# Patient Record
Sex: Female | Born: 2000 | Hispanic: Yes | Marital: Married | Smoking: Never smoker
Health system: Southern US, Community
[De-identification: ages and names within clinical notes are randomized; demographics above are authoritative.]

## PROBLEM LIST (undated history)

## (undated) HISTORY — PX: NO PAST SURGERIES: SHX2092

---
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
## 2021-03-14 IMAGING — US US OB TRANSVAGINAL
1 series · 15 of 27 positions shown · non-contrast
Comparison: 04/30/2020 and 05/07/2020

CLINICAL DATA: Known right ectopic pregnancy. To assess
methotrexate response. Lower abdominal pain radiating to the back.
Estimated gestational age by LMP is 9 weeks 1 day. Quantitative beta
HCG was 905 on 05/10/2020 compared with 3433 on 05/07/2020.

EXAM:
TRANSVAGINAL OB ULTRASOUND
TECHNIQUE: Transvaginal ultrasound was performed for complete evaluation of the
gestation as well as the maternal uterus, adnexal regions, and
pelvic cul-de-sac.

[Series 1: us ob transvaginal · 27 acquisitions, 15 frames shown]
[im 1/27]
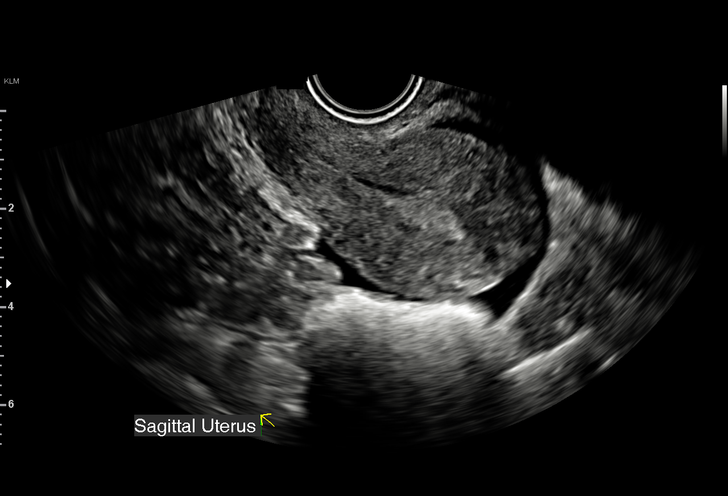
[im 3/27]
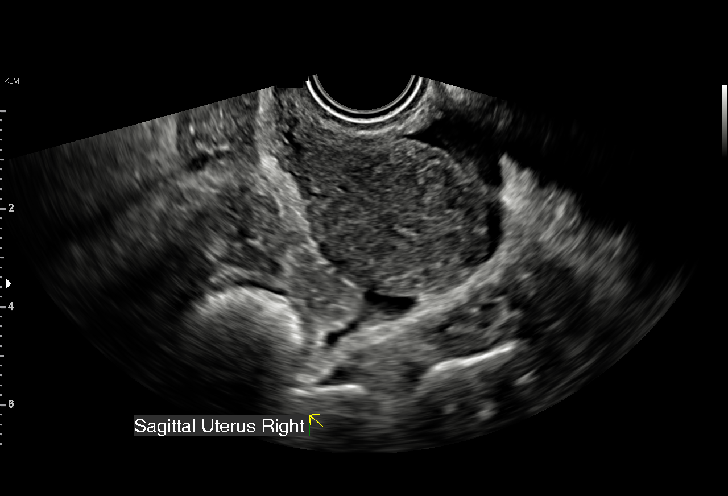
[im 5/27]
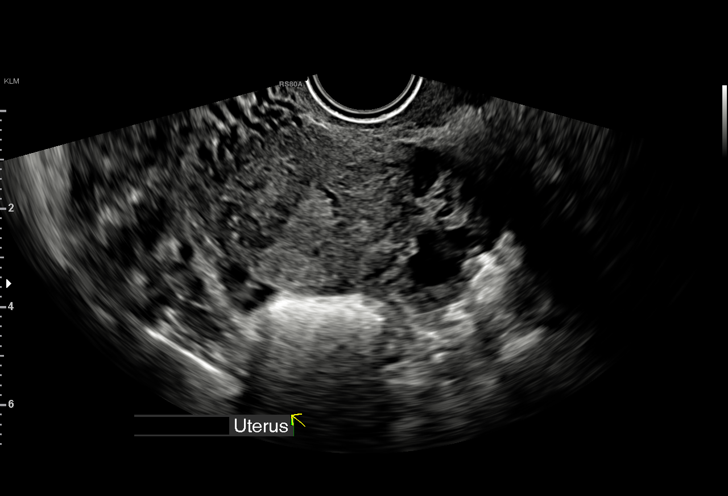
[im 7/27]
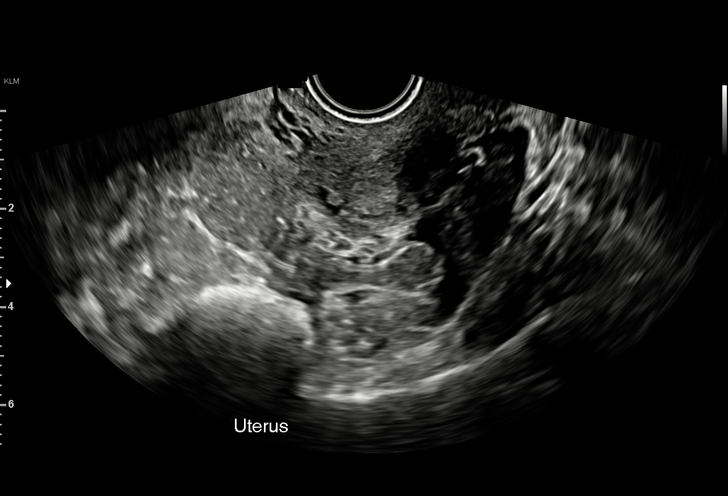
[im 9/27]
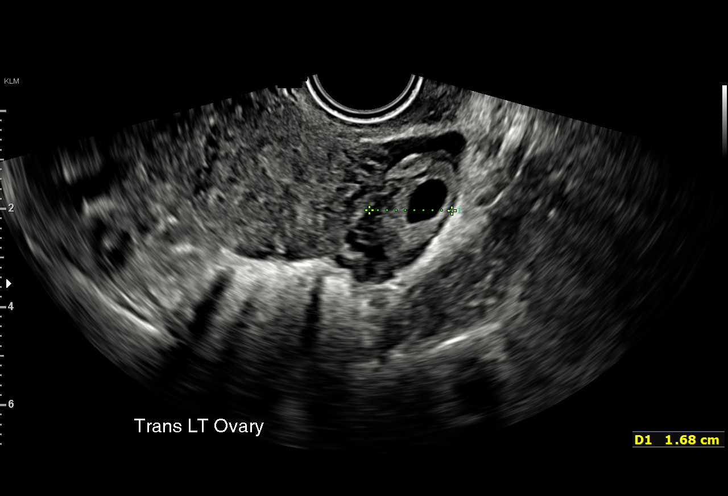
[im 10/27]
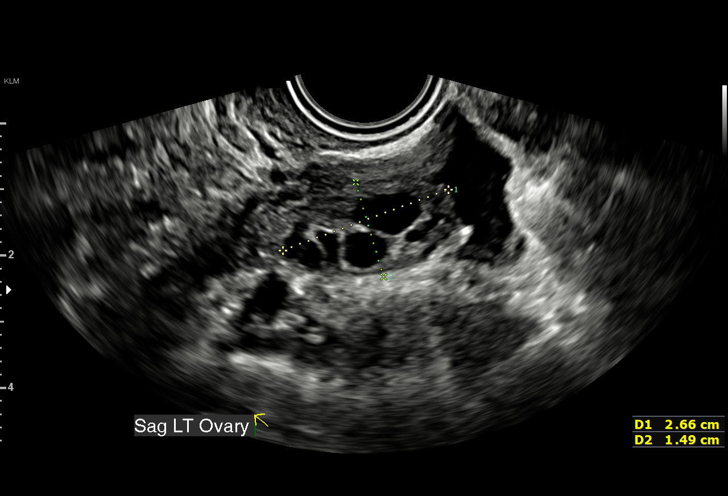
[im 12/27]
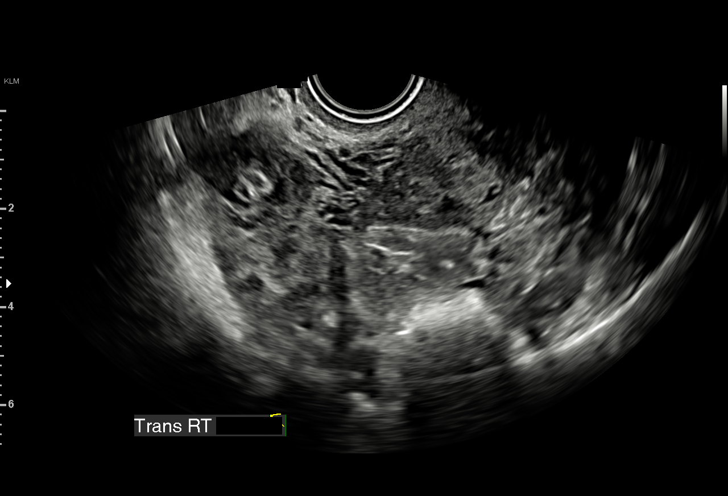
[im 14/27]
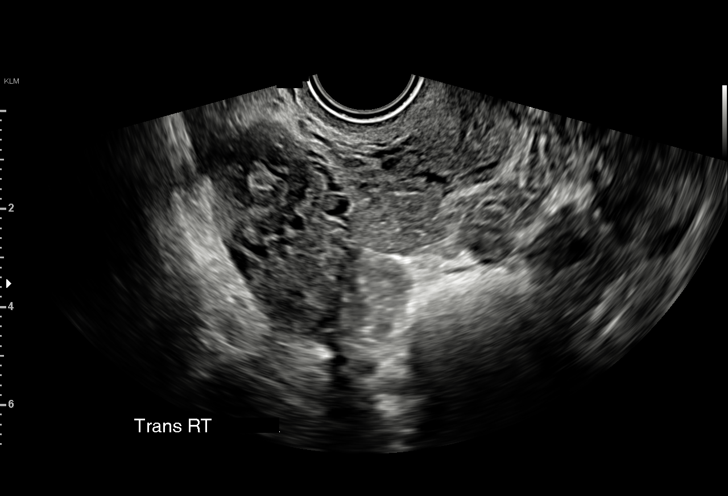
[im 16/27]
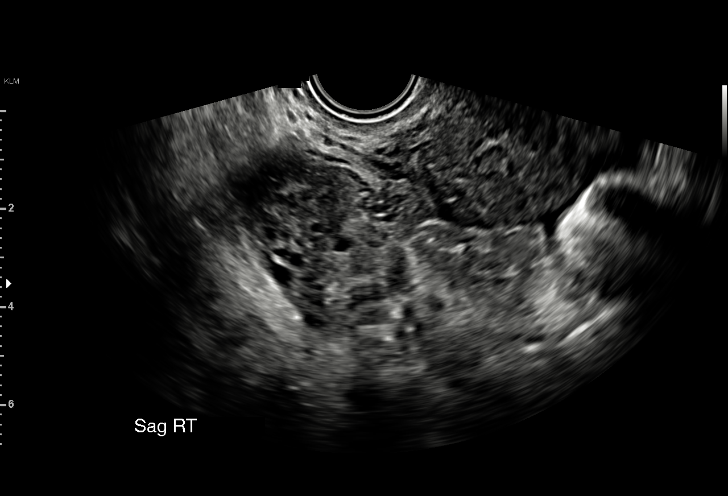
[im 18/27]
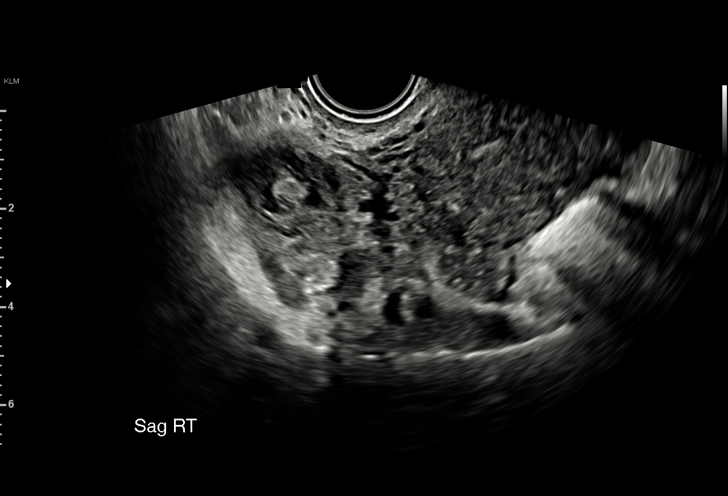
[im 19/27]
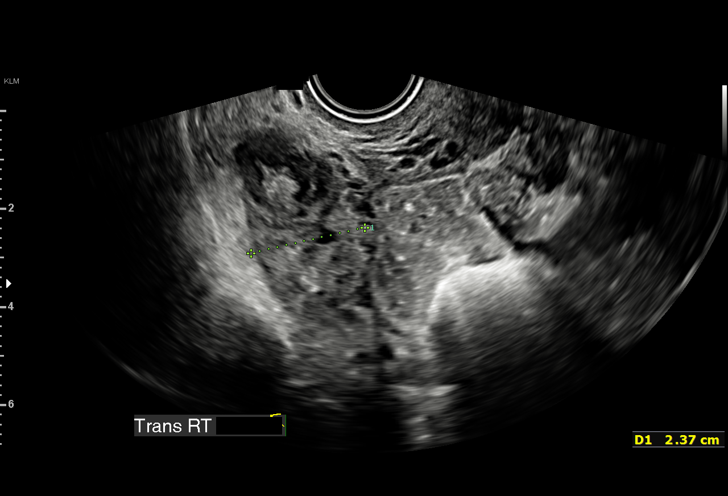
[im 21/27]
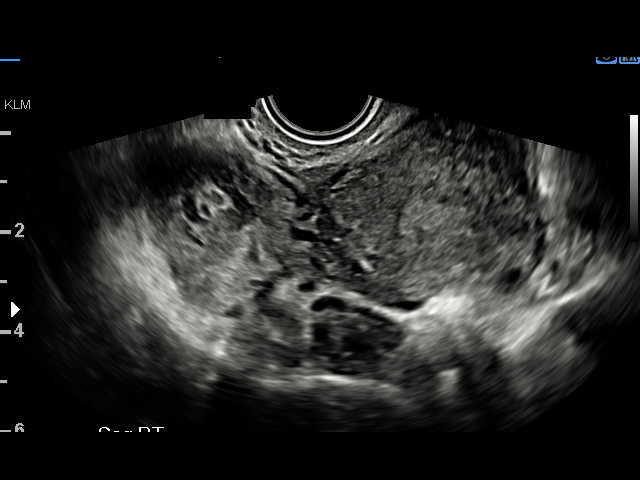
[im 23/27]
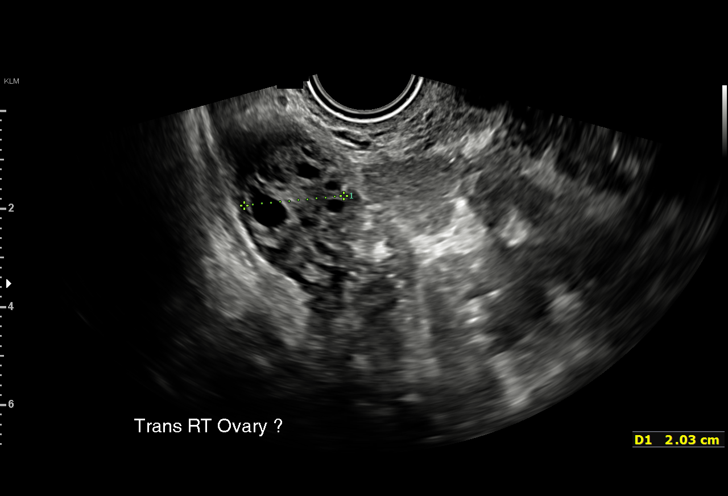
[im 25/27]
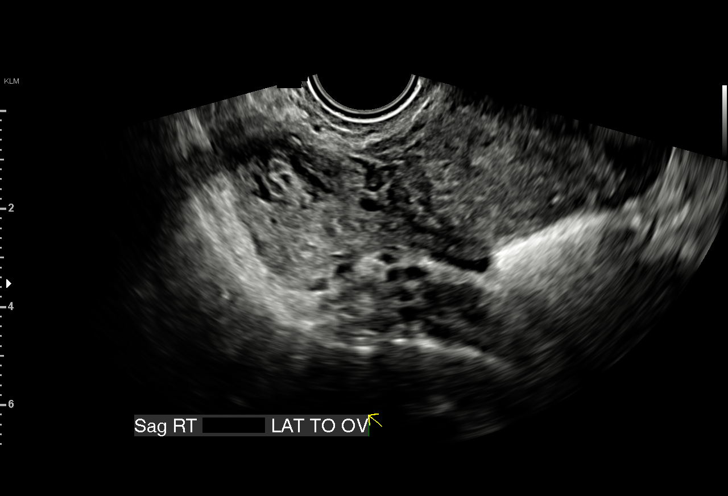
[im 27/27]
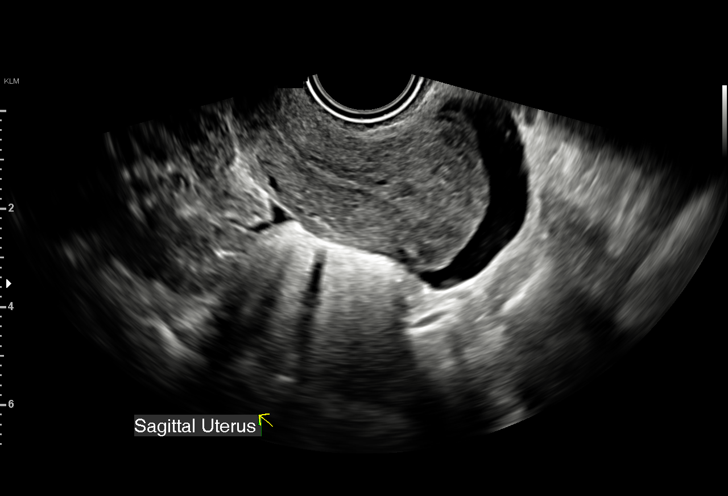

[15 of 27 positions shown; findings below may reference images not displayed]

FINDINGS: Intrauterine gestational sac: No intrauterine gestational sac is
identified.

Yolk sac:  Not identified

Embryo:  Not identified.

Cardiac Activity: Identified

Maternal uterus/adnexae: The uterus is retroverted. No myometrial
mass lesions identified. Endometrial stripe thickness is normal. No
endometrial fluid or focal lesion.

Heterogeneous echogenic right adnexal mass measuring 3.7 x 2.9 x
cm. This is contiguous with adjacent right ovarian tissue measuring
3.2 x 1.9 x 2 cm. Appearances are similar to previous study with
some interval decrease in size.

The left ovary measures 1.7 x 2.7 x 1.5 cm and contains normal
follicular changes.

There is interval development of small to moderate free fluid in the
pelvis.
IMPRESSION: 1. No intrauterine gestational sac identified.
2. Persistent but decreasing size of heterogeneous echogenic right
adnexal mass contiguous with the right ovary.
3. Interval development of small to moderate free fluid in the
pelvis.

## 2021-04-16 DIAGNOSIS — U071 COVID-19: Secondary | ICD-10-CM

## 2021-04-16 DIAGNOSIS — R21 Rash and other nonspecific skin eruption: Secondary | ICD-10-CM | POA: Insufficient documentation

## 2021-04-16 DIAGNOSIS — R1110 Vomiting, unspecified: Secondary | ICD-10-CM | POA: Insufficient documentation

## 2021-04-16 DIAGNOSIS — O98511 Other viral diseases complicating pregnancy, first trimester: Secondary | ICD-10-CM | POA: Insufficient documentation

## 2021-04-16 DIAGNOSIS — R059 Cough, unspecified: Secondary | ICD-10-CM | POA: Insufficient documentation

## 2021-04-16 DIAGNOSIS — O26891 Other specified pregnancy related conditions, first trimester: Secondary | ICD-10-CM | POA: Insufficient documentation

## 2021-04-16 DIAGNOSIS — R0602 Shortness of breath: Secondary | ICD-10-CM | POA: Insufficient documentation

## 2021-04-16 DIAGNOSIS — Z3A11 11 weeks gestation of pregnancy: Secondary | ICD-10-CM

## 2021-04-16 NOTE — MAU Provider Note (Addendum)
History     CSN: [id number]  Arrival date and time: 04/16/21 1817   Event Date/Time   First Provider Initiated Contact with Patient 04/16/21 1945      Chief Complaint  Patient presents with   Rash   HPI Heather Perez is a 21 y.o. G3P0010 at in early pregnancy who presents to MAU with chief complaints of facial rash and shortness of breath. These are new problems, onset today. Patient states she has been coughing, and the coughing becomes so severe that she vomits. She noted the shortness of breath and "tiny little red spots" on her face after these episodes. Patient is very tearful. She states she is scare because she is never sick and right now she feels very ill. She denies abdominal pain, vaginal bleeding, dysuria, fever or recent illness.  OB History     Gravida  3   Para      Term      Preterm      AB  2   Living         SAB  1   IAB      Ectopic  1   Multiple      Live Births              History reviewed. No pertinent past medical history.  Past Surgical History:  Procedure Laterality Date   NO PAST SURGERIES      History reviewed. No pertinent family history.  Social History   Tobacco Use   Smoking status: Never   Smokeless tobacco: Never  Substance Use Topics   Alcohol use: Not Currently   Drug use: Not Currently    Allergies: No Known Allergies  No medications prior to admission.    Review of Systems  Constitutional:  Positive for fatigue.  Respiratory:  Positive for cough.   Gastrointestinal:  Positive for nausea and vomiting.  Skin:  Positive for rash.  All other systems reviewed and are negative. Physical Exam   Blood pressure 106/67, pulse 92, temperature 98.1 F (36.7 C), temperature source Oral, resp. rate 18, last menstrual period 01/25/2021, SpO2 100 %, unknown if currently breastfeeding.  Physical Exam Vitals and nursing note reviewed. Exam conducted with a chaperone present.  Constitutional:      Appearance:  Normal appearance.  HENT:     Mouth/Throat:     Mouth: Mucous membranes are moist.  Cardiovascular:     Rate and Rhythm: Tachycardia present.     Pulses: Normal pulses.     Heart sounds: Normal heart sounds.  Pulmonary:     Effort: Pulmonary effort is normal.     Breath sounds: Normal breath sounds.  Abdominal:     General: Abdomen is flat.  Skin:    Capillary Refill: Capillary refill takes less than 2 seconds.     Comments: Scant petechiae on cheeks and temples  Neurological:     Mental Status: She is alert and oriented to person, place, and time.  Psychiatric:        Mood and Affect: Mood normal.        Behavior: Behavior normal.        Thought Content: Thought content normal.        Judgment: Judgment normal.    MAU Course/MDM  Procedures  Orders Placed This Encounter  Procedures   Resp Panel by RT-PCR (Flu A&B, Covid) Nasopharyngeal Swab   Culture, OB Urine   CBC with Differential/Platelet   Comprehensive metabolic panel   Urinalysis,  Routine w reflex microscopic   Pregnancy, urine POC   Insert peripheral IV   Discharge patient   Patient Vitals for the past 24 hrs:  BP Temp Temp src Pulse Resp SpO2  04/17/21 0058 106/67 -- -- 92 -- --  04/16/21 2244 -- 98.1 F (36.7 C) Oral -- -- --  04/16/21 1935 110/73 100.1 F (37.8 C) Oral (!) 103 18 100 %   Results for orders placed or performed during the hospital encounter of 04/16/21 (from the past 24 hour(s))  Pregnancy, urine POC     Status: Abnormal   Collection Time: 04/16/21  6:50 PM  Result Value Ref Range   Preg Test, Ur POSITIVE (A) NEGATIVE  CBC with Differential/Platelet     Status: Abnormal   Collection Time: 04/16/21  8:10 PM  Result Value Ref Range   WBC 6.9 4.0 - 10.5 K/uL   RBC 4.03 3.87 - 5.11 MIL/uL   Hemoglobin 12.2 12.0 - 15.0 g/dL   HCT 76.8 (L) 11.5 - 72.6 %   MCV 84.6 80.0 - 100.0 fL   MCH 30.3 26.0 - 34.0 pg   MCHC 35.8 30.0 - 36.0 g/dL   RDW 20.3 55.9 - 74.1 %   Platelets 273 150 - 400  K/uL   nRBC 0.0 0.0 - 0.2 %   Neutrophils Relative % 79 %   Neutro Abs 5.4 1.7 - 7.7 K/uL   Lymphocytes Relative 10 %   Lymphs Abs 0.7 0.7 - 4.0 K/uL   Monocytes Relative 10 %   Monocytes Absolute 0.7 0.1 - 1.0 K/uL   Eosinophils Relative 0 %   Eosinophils Absolute 0.0 0.0 - 0.5 K/uL   Basophils Relative 1 %   Basophils Absolute 0.0 0.0 - 0.1 K/uL   Immature Granulocytes 0 %   Abs Immature Granulocytes 0.03 0.00 - 0.07 K/uL  Comprehensive metabolic panel     Status: Abnormal   Collection Time: 04/16/21  8:10 PM  Result Value Ref Range   Sodium 134 (L) 135 - 145 mmol/L   Potassium 3.4 (L) 3.5 - 5.1 mmol/L   Chloride 105 98 - 111 mmol/L   CO2 20 (L) 22 - 32 mmol/L   Glucose, Bld 92 70 - 99 mg/dL   BUN <5 (L) 6 - 20 mg/dL   Creatinine, Ser 6.38 0.44 - 1.00 mg/dL   Calcium 9.1 8.9 - 45.3 mg/dL   Total Protein 6.8 6.5 - 8.1 g/dL   Albumin 3.6 3.5 - 5.0 g/dL   AST 26 15 - 41 U/L   ALT 22 0 - 44 U/L   Alkaline Phosphatase 69 38 - 126 U/L   Total Bilirubin 0.4 0.3 - 1.2 mg/dL   GFR, Estimated >64 >68 mL/min   Anion gap 9 5 - 15  Urinalysis, Routine w reflex microscopic     Status: Abnormal   Collection Time: 04/16/21  8:22 PM  Result Value Ref Range   Color, Urine YELLOW YELLOW   APPearance HAZY (A) CLEAR   Specific Gravity, Urine 1.020 1.005 - 1.030   pH 5.0 5.0 - 8.0   Glucose, UA NEGATIVE NEGATIVE mg/dL   Hgb urine dipstick NEGATIVE NEGATIVE   Bilirubin Urine NEGATIVE NEGATIVE   Ketones, ur 80 (A) NEGATIVE mg/dL   Protein, ur 30 (A) NEGATIVE mg/dL   Nitrite NEGATIVE NEGATIVE   Leukocytes,Ua LARGE (A) NEGATIVE   RBC / HPF 0-5 0 - 5 RBC/hpf   WBC, UA 21-50 0 - 5 WBC/hpf   Bacteria, UA FEW (  A) NONE SEEN   Squamous Epithelial / LPF 6-10 0 - 5   Mucus PRESENT   Resp Panel by RT-PCR (Flu A&B, Covid) Nasopharyngeal Swab     Status: Abnormal   Collection Time: 04/16/21  8:48 PM   Specimen: Nasopharyngeal Swab; Nasopharyngeal(NP) swabs in vial transport medium  Result Value  Ref Range   SARS Coronavirus 2 by RT PCR POSITIVE (A) NEGATIVE   Influenza A by PCR NEGATIVE NEGATIVE   Influenza B by PCR NEGATIVE NEGATIVE    Meds ordered this encounter  Medications   lactated ringers bolus 1,000 mL   DISCONTD: M.V.I. Adult (INFUVITE ADULT) 10 mL in lactated ringers 1,000 mL infusion   acetaminophen (TYLENOL) tablet 1,000 mg   benzonatate (TESSALON) capsule 200 mg   benzonatate (TESSALON PERLES) 100 MG capsule    Sig: Take 2 capsules (200 mg total) by mouth 3 (three) times daily as needed for up to 10 days for cough.    Dispense:  20 capsule    Refill:  0    Order Specific Question:   Supervising Provider    Answer:   Hermina Staggers [[id number]]   Remote interpreter utilized for all patient interaction  Report given to Judeth Horn, NP who assumes care at this time  Clayton Bibles, MSA, MSN, CNM Certified Nurse Midwife, [facility] for [employer], [facility] 04/16/21 9:26 PM  Patient positive for COVID.  Vitals & symptoms improved with IV fluids, tylenol, and tessalon  Stable to discharge. Discussed symptomatic tx & quarantine.   FHT present at 158 bpm  *Video Spanish interpreter used for this encounter* Assessment and Plan   1. COVID-19 affecting pregnancy in first trimester   2. [redacted] weeks gestation of pregnancy    -Rx tessalon -OTC meds for symptomatic tx -quarantine -f/u with [facility] for prenatal care  Judeth Horn, NP

## 2021-04-17 DIAGNOSIS — O98511 Other viral diseases complicating pregnancy, first trimester: Secondary | ICD-10-CM

## 2021-04-17 DIAGNOSIS — U071 COVID-19: Secondary | ICD-10-CM

## 2021-04-17 DIAGNOSIS — Z3A11 11 weeks gestation of pregnancy: Secondary | ICD-10-CM

## 2021-04-17 MED ORDER — BENZONATATE 100 MG PO CAPS
200.0000 mg | ORAL_CAPSULE | Freq: Three times a day (TID) | ORAL | 0 refills | Status: AC | PRN
Start: 2021-04-17 — End: 2021-04-27

## 2021-04-17 NOTE — Discharge Instructions (Signed)
Las medicinas seguras para tomar Academic librarian  Safe Medications in Pregnancy  Acn:  Benzoyl Peroxide (Perxido de benzolo)  Salicylic Acid (cido saliclico)  Dolor de espalda/Dolor de cabeza:  Tylenol: 2 pastillas de concentracin regular cada 4 horas O 2 pastillas de concentracin fuerte cada 6 horas  Resfriados/Tos/Alergias:  Benadryl (sin alcohol) 25 mg cada 6 horas segn lo necesite Breath Right strips (Tiras para respirar correctamente)  Claritin  Cepacol (pastillas de chupar para la garganta)  Chloraseptic (aerosol para la garganta)  Cold-Eeze- hasta tres veces por da  Cough drops (pastillas de chupar para la tos, sin alcohol)  Flonase (con receta mdica solamente)  Guaifenesin  Mucinex  Robitussin DM (simple solamente, sin alcohol)  Saline nasal spray/drops (Aerosol nasal salino/gotas) Sudafed (pseudoephedrine) y  Actifed * utilizar slo despus de 12 semanas de gestacin y si no tiene la presin arterial alta.  Tylenol Vicks  VapoRub  Zinc lozenges (pastillas para la garganta)  Zyrtec  Estreimiento:  Colace  Ducolax (supositorios)  Fleet enema (lavado intestinal rectal)  Glycerin (supositorios)  Metamucil  Milk of magnesia (leche de magnesia)  Miralax  Senokot  Smooth Move (t)  Diarrea:  Kaopectate Imodium A-D  *NO tome Pepto-Bismol  Hemorroides:  Anusol  Anusol HC  Preparation H  Tucks  Indigestin:  Tums  Maalox  Mylanta  Zantac  Pepcid  Insomnia:  Benadryl (sin alcohol) 25mg  cada 6 horas segn lo necesite  Tylenol PM  Unisom, no Gelcaps  Calambres en las piernas:  Tums  MagGel Nuseas/Vmitos:  Bonine  Dramamine  Emetrol  Ginger (extracto)  Sea-Bands  Meclizine  Medicina para las nuseas que puede tomar durante el embarazo: Unisom (doxylamine succinate, pastillas de 25 mg) Tome una pastilla al da al [place]. Si los sntomas no estn adecuadamente controlados, la dosis puede aumentarse hasta una dosis mxima recomendada de [place] al da (1/2 pastilla por la [place], 1/2 pastilla a media tarde y [place] pastilla al [place]). Pastillas de Vitamina B6 de 100mg . Tome [place] veces al da (hasta 200 mg por da).  Erupciones en la piel:  Productos de Aveeno  Benadryl cream (crema o una dosis de 25mg  cada 6 horas segn lo necesite)  Calamine Lotion (locin)  1% cortisone cream (crema de cortisona de 1%)  nfeccin vaginal por hongos (candidiasis):  Gyne-lotrimin 7  Monistat 7   **Si est tomando varias medicinas, por favor revise las etiquetas para los mismos ingredientes [place]. **Tome la medicina segn lo indicado en la etiqueta. **No tome ms de 400 mg de Tylenol en 24 horas. **No tome medicinas que contengan aspirina o ibuprofeno.        [facility] Area Art gallery manager for [place] at [facility]  [address], [place], [facility] [address]  [phone]  Center for [facility] Healthcare at [facility]  [address], [place], [facility] [address]  [phone]  Center for [facility] at [facility] [address] [place], [facility] [address]  [phone]  Center for [facility] Healthcare at [facility]  [address] [facility] [place], [address] [place]  [phone]  Center for [facility] at [facility]  [address] (First floor), [place], [facility] [address]  [phone]  Center for [facility] [address] [address], [place], [facility] [place]via [place] [phone]  Center for [facility] Healthcare at [facility]  [address] [place], [place], [address] [place]  [phone]  Central  [address] Ob/gyn  [address], [place], [place] [address]  [phone]  [facility]  [address] [place], [place], [place] [address]  [phone]  [facility]  [address] [place], [place] [address]  [phone]  [facility]  [address] [place], [place] [address]  [phone]  [facility]  [address] [place] [address], [place], [place] [address]  [phone]  [facility]   [address] [place], [place] [address]  [phone]  [facility]  [address], [place], [place] [address]   [phone]  [facility]  [address], [place], [place] [address]  [phone]

## 2023-03-26 ENCOUNTER — Encounter: Payer: Self-pay | Admitting: Obstetrics & Gynecology

## 2023-03-26 DIAGNOSIS — Z363 Encounter for antenatal screening for malformations: Secondary | ICD-10-CM

## 2023-04-30 ENCOUNTER — Ambulatory Visit: Payer: Self-pay
